# Patient Record
Sex: Female | Born: 1995 | Race: White | Hispanic: No | Marital: Single | Smoking: Never smoker
Health system: Southern US, Community
[De-identification: ages and names within clinical notes are randomized; demographics above are authoritative.]

---
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
## 2017-01-24 ENCOUNTER — Ambulatory Visit: Payer: Self-pay | Admitting: Pediatrics

## 2017-01-26 ENCOUNTER — Ambulatory Visit (INDEPENDENT_AMBULATORY_CARE_PROVIDER_SITE_OTHER): Payer: BLUE CROSS/BLUE SHIELD | Admitting: Allergy and Immunology

## 2017-01-26 ENCOUNTER — Encounter: Payer: Self-pay | Admitting: Allergy and Immunology

## 2017-01-26 VITALS — BP 96/64 | HR 62 | Temp 98.1°F | Resp 16 | Ht 69.5 in | Wt 145.0 lb

## 2017-01-26 DIAGNOSIS — R040 Epistaxis: Secondary | ICD-10-CM | POA: Insufficient documentation

## 2017-01-26 DIAGNOSIS — J3089 Other allergic rhinitis: Secondary | ICD-10-CM

## 2017-01-26 MED ORDER — CARBINOXAMINE MALEATE 6 MG PO TABS: 6.0000 mg | ORAL_TABLET | Freq: Three times a day (TID) | ORAL | 3 refills | Status: DC

## 2017-01-26 MED ORDER — AZELASTINE HCL 0.1 % NA SOLN: NASAL | 5 refills | Status: AC

## 2017-01-26 NOTE — Assessment & Plan Note (Signed)
   Proper technique for stanching epistaxis has been discussed and demonstrated.  Nasal saline spray and/or nasal saline gel is recommended to moisturize nasal mucosa.  The use of a cool-mist humidifier during the night is recommended.  During epistaxis, if needed, oxymetazoline (Afrin) nasal sparay may be applied to a cotton ball to help stanch the blood flow.  If this problem persists or progresses, otolaryngology evaluation may be warranted.  

## 2017-01-26 NOTE — Patient Instructions (Addendum)
Perennial allergic rhinitis with a possible nonallergic component  Aeroallergen avoidance measures have been discussed and provided in written form.  For now, continue azelastine nasal spray, one spray per nostril 1-2 times daily as needed. Proper nasal spray technique has been discussed and demonstrated.  Nasal saline lavage (NeilMed) has been recommended as needed and prior to medicated nasal sprays along with instructions for proper administration.  A prescription has been provided for RyVent (carbinoxamine maleate) 6mg  every 6-8 hours as needed.  For nasal congestion, ear pressure, and/or sinus pressure, may add pseudoephedrine 60 mg twice daily as needed. Pseudoephedrine is only to be used for short-term relief of nasal/sinus congestion. Long-term use is discouraged due to potential side effects.  If allergen avoidance measures and medications fail to adequately relieve symptoms, aeroallergen immunotherapy will be considered.  Epistaxis  Proper technique for stanching epistaxis has been discussed and demonstrated.  Nasal saline spray and/or nasal saline gel is recommended to moisturize nasal mucosa.  The use of a cool-mist humidifier during the night is recommended.  During epistaxis, if needed, oxymetazoline (Afrin) nasal sparay may be applied to a cotton ball to help stanch the blood flow.  If this problem persists or progresses, otolaryngology evaluation may be warranted.    Return in about 4 months (around 05/27/2017), or if symptoms worsen or fail to improve.  Control of House Dust Mite Allergen  House dust mites play a major role in allergic asthma and rhinitis.  They occur in environments with high humidity wherever human skin, the food for dust mites is found. High levels have been detected in dust obtained from mattresses, pillows, carpets, upholstered furniture, bed covers, clothes and soft toys.  The principal allergen of the house dust mite is found in its feces.  A  gram of dust may contain 1,000 mites and 250,000 fecal particles.  Mite antigen is easily measured in the air during house cleaning activities.    1. Encase mattresses, including the box spring, and pillow, in an air tight cover.  Seal the zipper end of the encased mattresses with wide adhesive tape. 2. Wash the bedding in water of 130 degrees Farenheit weekly.  Avoid cotton comforters/quilts and flannel bedding: the most ideal bed covering is the dacron comforter. 3. Remove all upholstered furniture from the bedroom. 4. Remove carpets, carpet padding, rugs, and non-washable window drapes from the bedroom.  Wash drapes weekly or use plastic window coverings. 5. Remove all non-washable stuffed toys from the bedroom.  Wash stuffed toys weekly. 6. Have the room cleaned frequently with a vacuum cleaner and a damp dust-mop.  The patient should not be in a room which is being cleaned and should wait 1 hour after cleaning before going into the room. 7. Close and seal all heating outlets in the bedroom.  Otherwise, the room will become filled with dust-laden air.  An electric heater can be used to heat the room. 8. Reduce indoor humidity to less than 50%.  Do not use a humidifier.  Control of Cockroach Allergen  Cockroach allergen has been identified as an important cause of acute attacks of asthma, especially in urban settings.  There are fifty-five species of cockroach that exist in the [place], however only three, the [place], [place] and Oriental species produce allergen that can affect patients with Asthma.  Allergens can be obtained from fecal particles, egg casings and secretions from cockroaches.    1. Remove food sources. 2. Reduce access to water. 3. Seal access and entry points.  4. Spray runways with 0.5-1% Diazinon or Chlorpyrifos 5. Blow boric acid power under stoves and refrigerator. 6. Place bait stations (hydramethylnon) at feeding sites.

## 2017-01-26 NOTE — Progress Notes (Signed)
New Patient Note  RE: Karen Bowers MRN: [id number] DOB: 1995/06/07 Date of Office Visit: 01/26/2017  Referring provider: Christia ReadingBates, Dwight, MD Primary care provider: Patient, No Pcp Per  Chief Complaint: Nasal Congestion; Sinus Problem; and Epistaxis   History of present illness: Karen Bowers is a 21 y.o. female seen today in consultation requested by Christia Readingwight Bates, MD.  [place].  She states that over this past year she has been experiencing progressive nasal congestion, thick postnasal drainage, maxillary sinus pressure, ear popping, and occasional sneezing.  No significant seasonal symptom variation has been noted nor have specific environmental triggers been identified.  She currently attempts to control these symptoms with montelukast and azelastine nasal spray with mild benefit.  She reports that throughout her lifetime she has had nosebleeds.  She typically bleeds from the nose 1 time per month on average.  She is unable to identify any specific triggers for the nosebleeds and is not certain if the bleeding occurs from one or both nostrils.  It typically takes 5 minutes in multiple tissues to stanch the flow.  She has no personal history of asthma, eczema, or food allergies.   Assessment and plan: Perennial allergic rhinitis with a possible nonallergic component  Aeroallergen avoidance measures have been discussed and provided in written form.  For now, continue azelastine nasal spray, one spray per nostril 1-2 times daily as needed. Proper nasal spray technique has been discussed and demonstrated.  Nasal saline lavage (NeilMed) has been recommended as needed and prior to medicated nasal sprays along with instructions for proper administration.  A prescription has been provided for RyVent (carbinoxamine maleate) 6mg  every 6-8 hours as needed.  For nasal congestion, ear pressure, and/or sinus pressure, may add pseudoephedrine 60  mg twice daily as needed. Pseudoephedrine is only to be used for short-term relief of nasal/sinus congestion. Long-term use is discouraged due to potential side effects.  If allergen avoidance measures and medications fail to adequately relieve symptoms, aeroallergen immunotherapy will be considered.  Epistaxis  Proper technique for stanching epistaxis has been discussed and demonstrated.  Nasal saline spray and/or nasal saline gel is recommended to moisturize nasal mucosa.  The use of a cool-mist humidifier during the night is recommended.  During epistaxis, if needed, oxymetazoline (Afrin) nasal sparay may be applied to a cotton ball to help stanch the blood flow.  If this problem persists or progresses, otolaryngology evaluation may be warranted.    Meds ordered this encounter  Medications  . azelastine (ASTELIN) 0.1 % nasal spray    Sig: 1 spray per nostril 1-2 times daily as needed for runny nose    Dispense:  30 mL    Refill:  5  . Carbinoxamine Maleate (RYVENT) 6 MG TABS    Sig: Take 6 mg by mouth 3 (three) times daily.    Dispense:  30 tablet    Refill:  3    Put in as cash paying. Pt. Has a coupon.    Diagnostics: Epicutaneous testing: Negative despite a positive histamine control. Intradermal testing: Positive to dust mite antigen and cockroach antigen.   Physical examination: Blood pressure 96/64, pulse 62, temperature 98.1 F (36.7 C), temperature source Oral, resp. rate 16, height 5' 9.5" (1.765 m), weight 145 lb (65.8 kg), SpO2 99 %.  General: Alert, interactive, in no acute distress. HEENT: TMs pearly gray, turbinates moderately edematous with clear discharge, post-pharynx unremarkable. Neck: Supple without lymphadenopathy. Lungs: Clear to  auscultation without wheezing, rhonchi or rales. CV: Normal S1, S2 without murmurs. Abdomen: Nondistended, nontender. Skin: Warm and dry, without lesions or rashes. Extremities:  No clubbing, cyanosis or edema. Neuro:    Grossly intact.  Review of systems:  Review of systems negative except as noted in HPI / PMHx or noted below: Review of Systems  Constitutional: Negative.   HENT: Negative.   Eyes: Negative.   Respiratory: Negative.   Cardiovascular: Negative.   Gastrointestinal: Negative.   Genitourinary: Negative.   Musculoskeletal: Negative.   Skin: Negative.   Neurological: Negative.   Endo/Heme/Allergies: Negative.   Psychiatric/Behavioral: Negative.     Past medical history:  History reviewed. No pertinent past medical history.  Past surgical history:  History reviewed. No pertinent surgical history.  Family history: Family History  Problem Relation Age of Onset  . Asthma Mother   . Asthma Maternal Grandmother   . ALS Maternal Grandmother   . Angioedema Maternal Grandmother   . Allergic rhinitis Neg Hx   . Eczema Neg Hx   . Urticaria Neg Hx   . Immunodeficiency Neg Hx     Social history: Social History   Socioeconomic History  . Marital status: Single    Spouse name: Not on file  . Number of children: Not on file  . Years of education: Not on file  . Highest education level: Not on file  Social Needs  . Financial resource strain: Not on file  . Food insecurity - worry: Not on file  . Food insecurity - inability: Not on file  . Transportation needs - medical: Not on file  . Transportation needs - non-medical: Not on file  Occupational History  . Not on file  Tobacco Use  . Smoking status: Never Smoker  . Smokeless tobacco: Never Used  Substance and Sexual Activity  . Alcohol use: No    Frequency: Never  . Drug use: No  . Sexual activity: Not on file  Other Topics Concern  . Not on file  Social History Narrative  . Not on file   Environmental History: The patient lives in a 23 year old house with carpeting throughout, gas heat, and central air.  There are cats and dogs in the home which have access to her bedroom.  She is a non-smoker.  Allergies as of  01/26/2017   No Known Allergies     Medication List        Accurate as of 01/26/17  5:14 PM. Always use your most recent med list.          azelastine 0.1 % nasal spray Commonly known as:  ASTELIN 1 spray per nostril 1-2 times daily as needed for runny nose   Carbinoxamine Maleate 6 MG Tabs Commonly known as:  RYVENT Take 6 mg by mouth 3 (three) times daily.   montelukast 10 MG tablet Commonly known as:  SINGULAIR Take 10 mg by mouth.       Known medication allergies: No Known Allergies  I appreciate the opportunity to take part in Kaelani's care. Please do not hesitate to contact me with questions.  Sincerely,   R. Jorene Guestarter Donathan Buller, MD

## 2017-01-26 NOTE — Assessment & Plan Note (Signed)
   Aeroallergen avoidance measures have been discussed and provided in written form.  For now, continue azelastine nasal spray, one spray per nostril 1-2 times daily as needed. Proper nasal spray technique has been discussed and demonstrated.  Nasal saline lavage (NeilMed) has been recommended as needed and prior to medicated nasal sprays along with instructions for proper administration.  A prescription has been provided for RyVent (carbinoxamine maleate) 6mg  every 6-8 hours as needed.  For nasal congestion, ear pressure, and/or sinus pressure, may add pseudoephedrine 60 mg twice daily as needed. Pseudoephedrine is only to be used for short-term relief of nasal/sinus congestion. Long-term use is discouraged due to potential side effects.  If allergen avoidance measures and medications fail to adequately relieve symptoms, aeroallergen immunotherapy will be considered.

## 2017-05-24 ENCOUNTER — Other Ambulatory Visit: Payer: Self-pay | Admitting: Allergy

## 2017-05-24 ENCOUNTER — Telehealth: Payer: Self-pay | Admitting: Allergy

## 2017-05-24 NOTE — Telephone Encounter (Signed)
The only generic for carbinoxamine comes in 4 mg tablets.  Please call in carbinoxamine 4 mg tablets every 8 hours if needed.  Thank you.

## 2017-05-24 NOTE — Telephone Encounter (Signed)
Dr Nunzio CobbsBobbitt mother called ns wanted us to call in the generic for RyVent. Carbinoxmine Maleate tablets 6mg . tablet ok? Please advise.

## 2017-05-25 MED ORDER — CARBINOXAMINE MALEATE 4 MG PO TABS: ORAL_TABLET | ORAL | 5 refills | Status: DC

## 2017-05-25 NOTE — Telephone Encounter (Signed)
Done.  Rx sent in. Patient informed.

## 2017-05-25 NOTE — Addendum Note (Signed)
Addended byClyda Greener: Shmuel Girgis M on: 05/25/2017 11:02 AM   Modules accepted: Orders

## 2017-07-06 ENCOUNTER — Other Ambulatory Visit: Payer: Self-pay | Admitting: Allergy

## 2017-07-06 MED ORDER — CARBINOXAMINE MALEATE 4 MG PO TABS: ORAL_TABLET | ORAL | 1 refills | Status: AC

## 2019-06-04 ENCOUNTER — Other Ambulatory Visit: Payer: Self-pay

## 2019-06-04 ENCOUNTER — Ambulatory Visit
Admission: RE | Admit: 2019-06-04 | Discharge: 2019-06-04 | Disposition: A | Discharge status: Home / Self Care | Payer: No Typology Code available for payment source | Source: Ambulatory Visit | Attending: Nurse Practitioner | Admitting: Nurse Practitioner

## 2019-06-04 ENCOUNTER — Other Ambulatory Visit: Payer: Self-pay | Admitting: Nurse Practitioner

## 2019-06-04 DIAGNOSIS — Z021 Encounter for pre-employment examination: Secondary | ICD-10-CM

## 2021-07-16 IMAGING — CR DG CHEST 1V
2 series · 2 of 2 positions shown · non-contrast
Comparison: None.

CLINICAL DATA: Pre-employment physical

EXAM:
CHEST  1 VIEW

[w chest pa (1 of 2)]
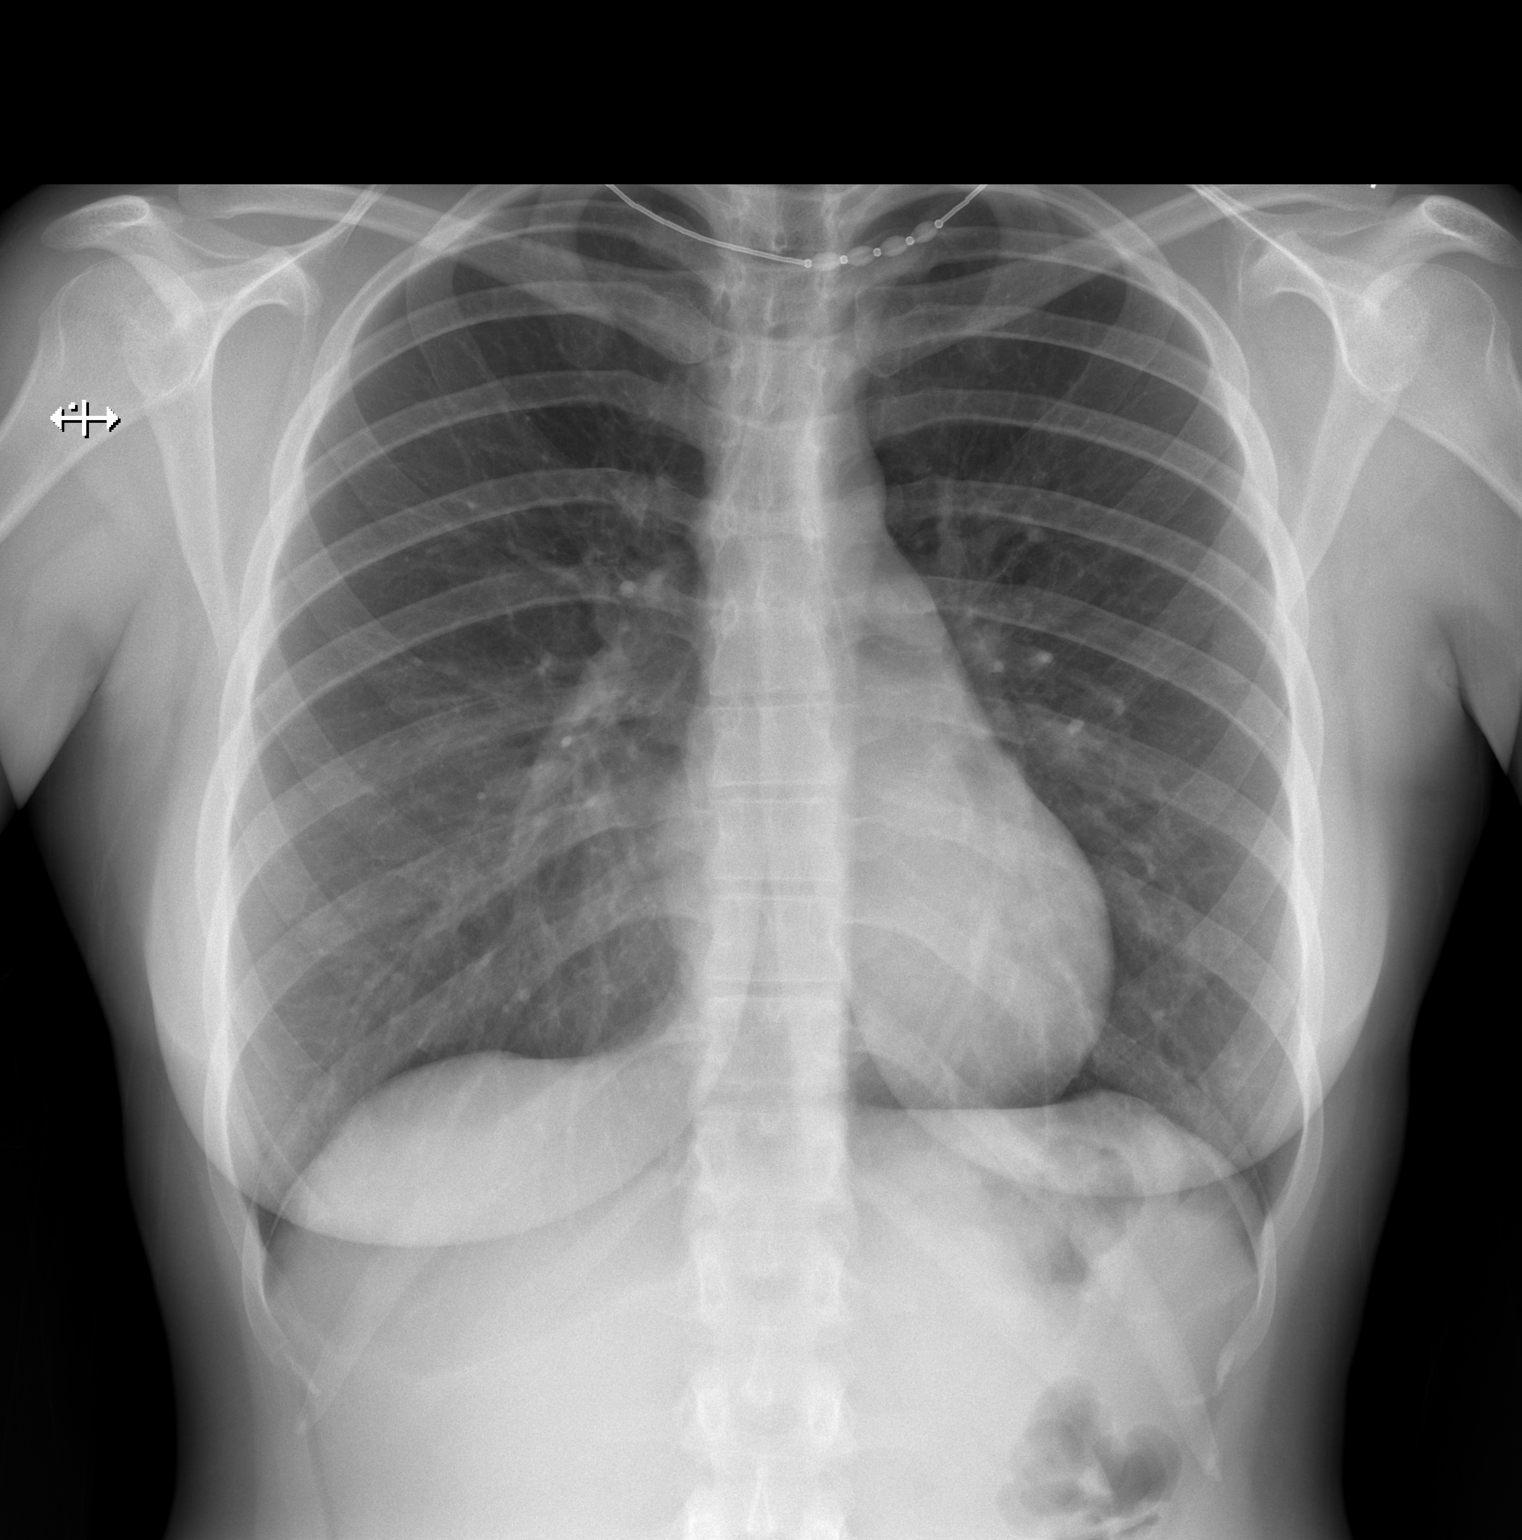

[w chest pa (2 of 2)]
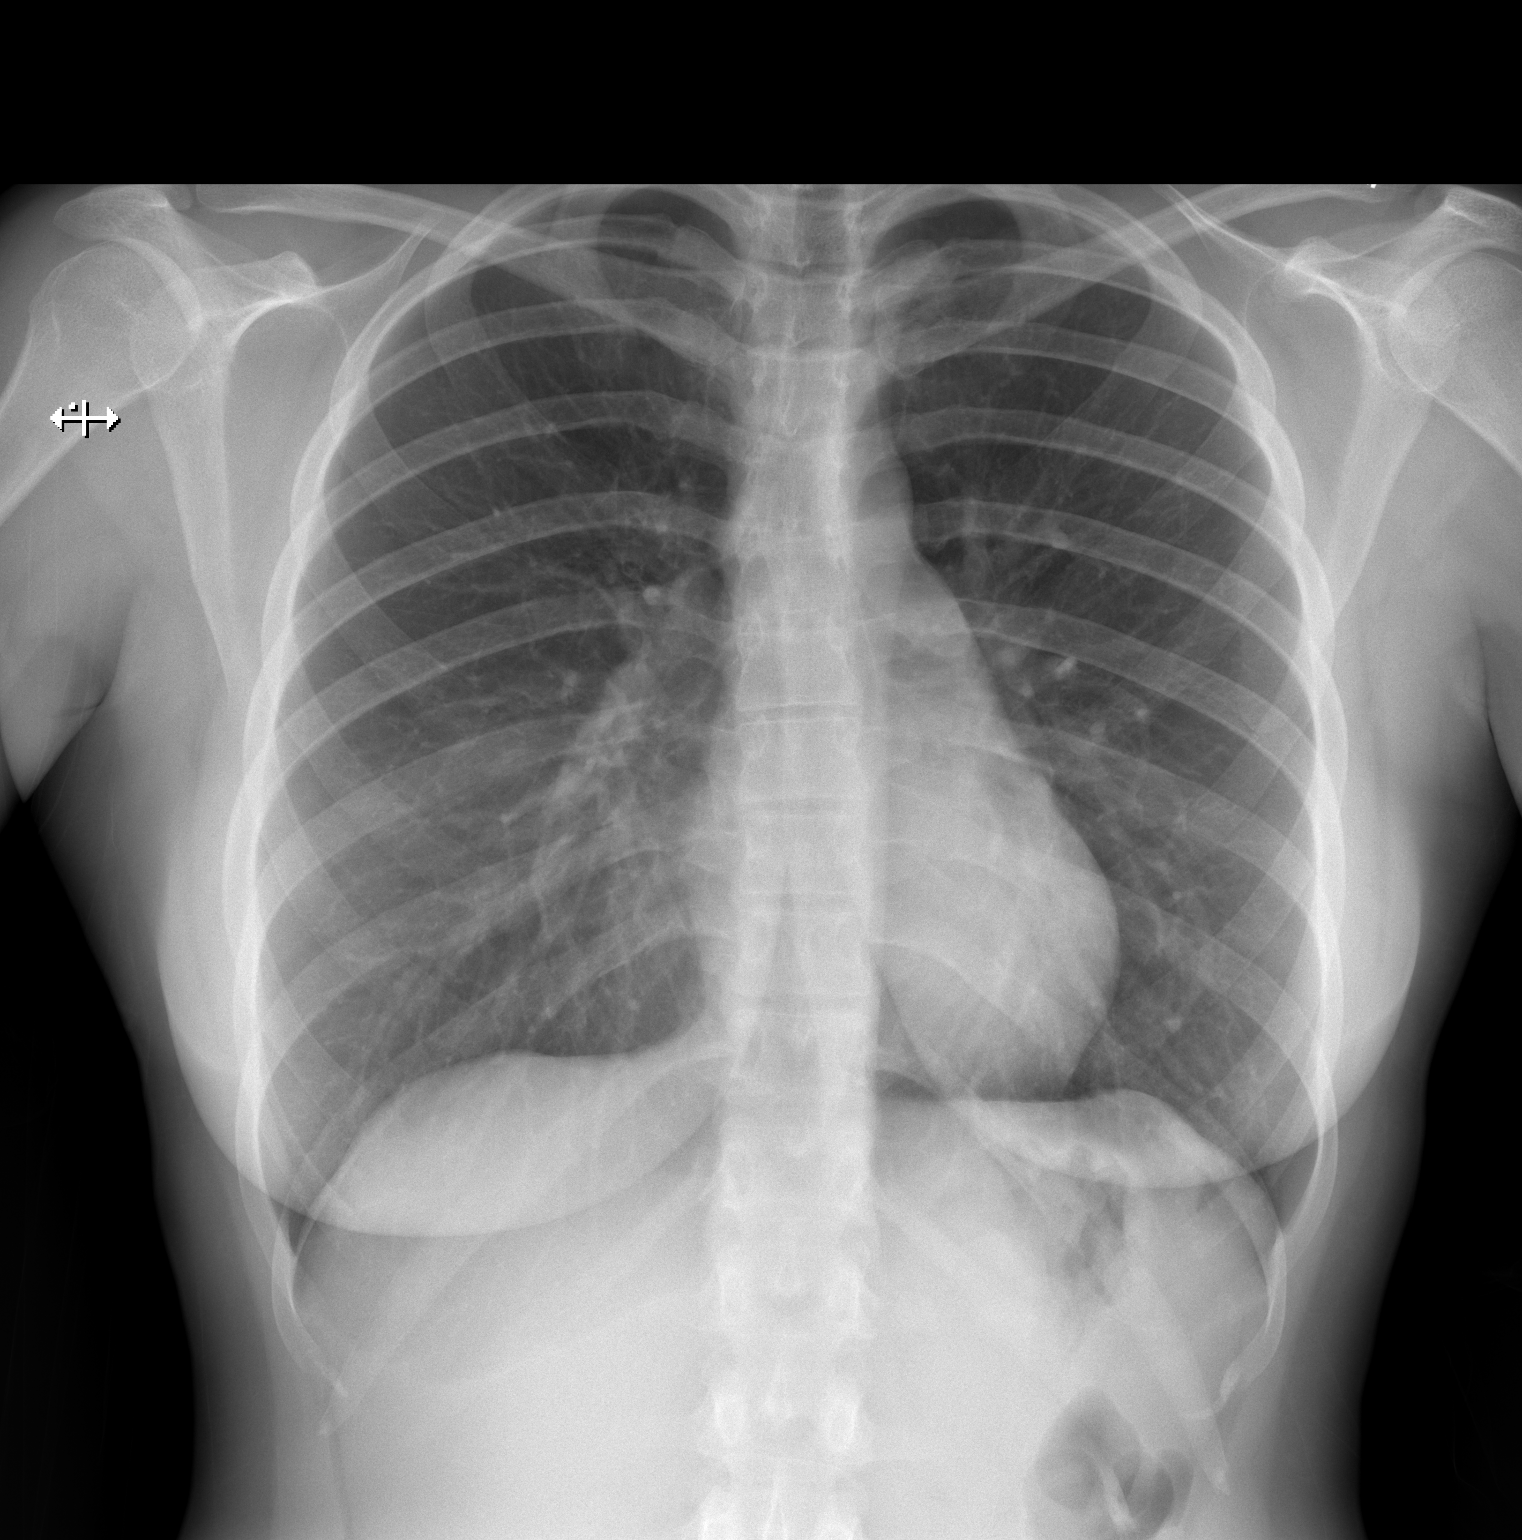

[2 of 2 positions shown; findings below may reference images not displayed]

FINDINGS: Two frontal views of the chest demonstrate an unremarkable cardiac
silhouette. No airspace disease, effusion, or pneumothorax. No acute
bony abnormality.
IMPRESSION: 1. No acute intrathoracic process.
